# Patient Record
Sex: Male | Born: 1940 | Race: White | Hispanic: No
Health system: Western US, Academic
[De-identification: ages and names within clinical notes are randomized; demographics above are authoritative.]

## PROBLEM LIST (undated history)

## (undated) DEATH — deceased

---

## 2021-03-07 ENCOUNTER — Emergency Department
Admission: EM | Admit: 2021-03-07 | Discharge: 2021-03-07 | Discharge status: Against Medical Advice | Payer: Medicare Other | Attending: Emergency Medicine | Admitting: Emergency Medicine

## 2021-03-07 DIAGNOSIS — L989 Disorder of the skin and subcutaneous tissue, unspecified: Secondary | ICD-10-CM | POA: Insufficient documentation

## 2021-03-07 DIAGNOSIS — Z5321 Procedure and treatment not carried out due to patient leaving prior to being seen by health care provider: Secondary | ICD-10-CM

## 2021-03-07 NOTE — ED Triage Notes (Signed)
Pt to ER with report of skin tear to left arm x1 week ago, with re-injury today. Unable to control bleeding at home due to blood thinner use. Unknown blood thinner.

## 2021-09-15 IMAGING — US DOP ARTERIAL LWR EXT RT
1 series · 14 of 16 positions shown · non-contrast
Comparison: None.

HISTORY: 78-year-old Male with claudication.
TECHNIQUE: Using real-time and Doppler ultrasound, the right leg arterial system was evaluated.

[Series 1: dop arterial lwr ext right · arterial · 14 of 25 slices shown]
[im 1/25]
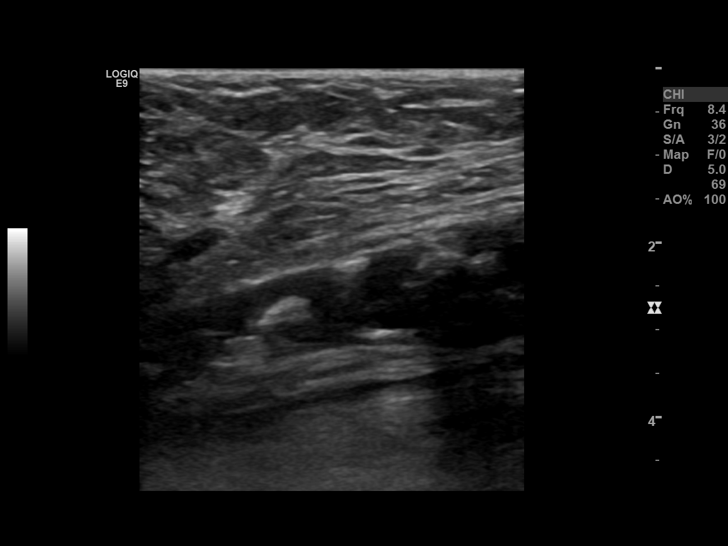
[im 2/25]
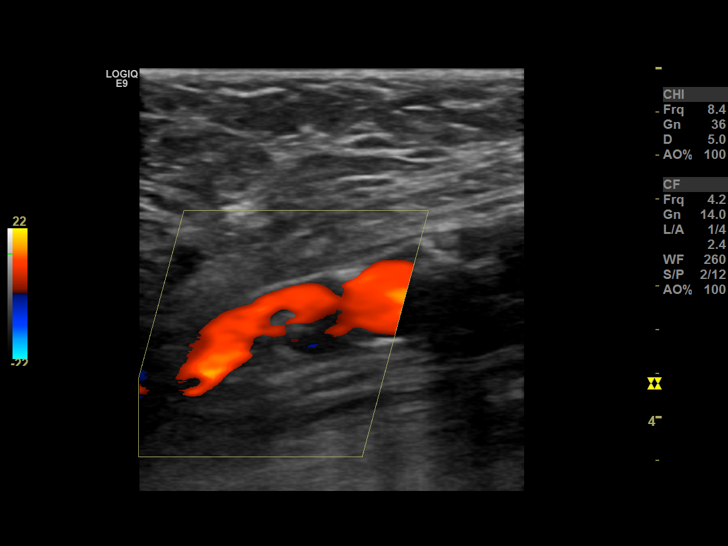
[im 4/25]
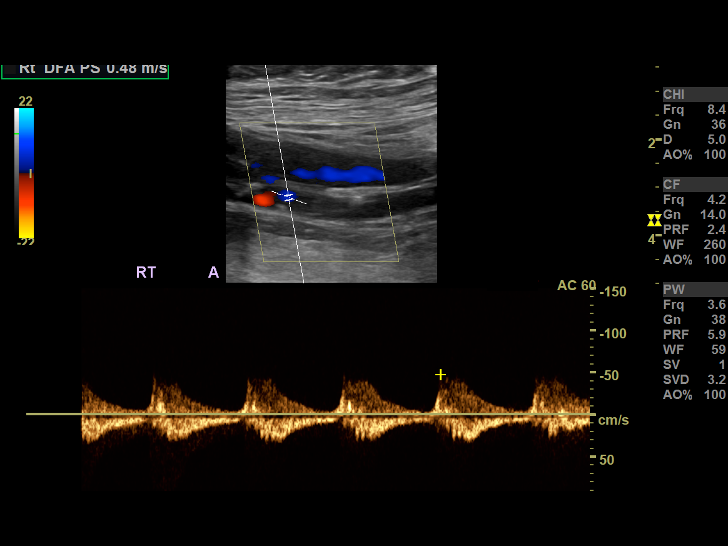
[im 7/25]
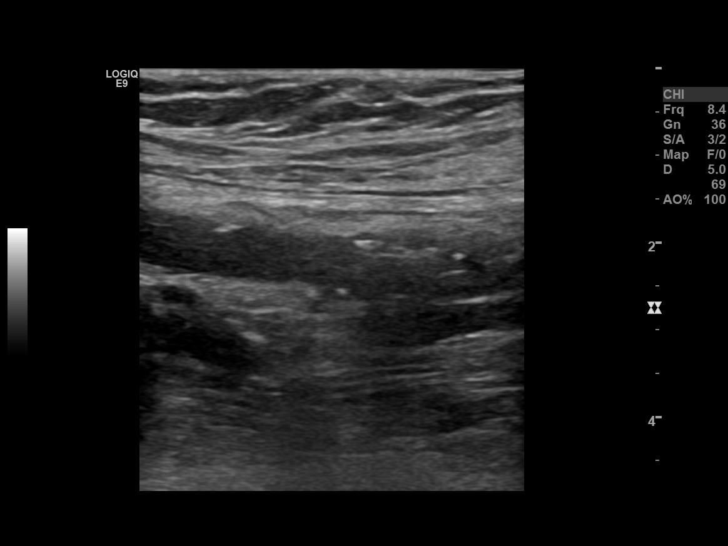
[im 9/25]
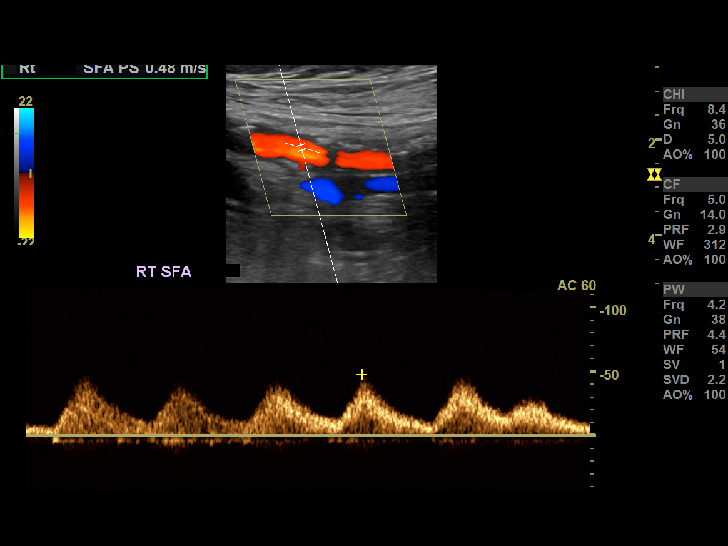
[im 10/25]
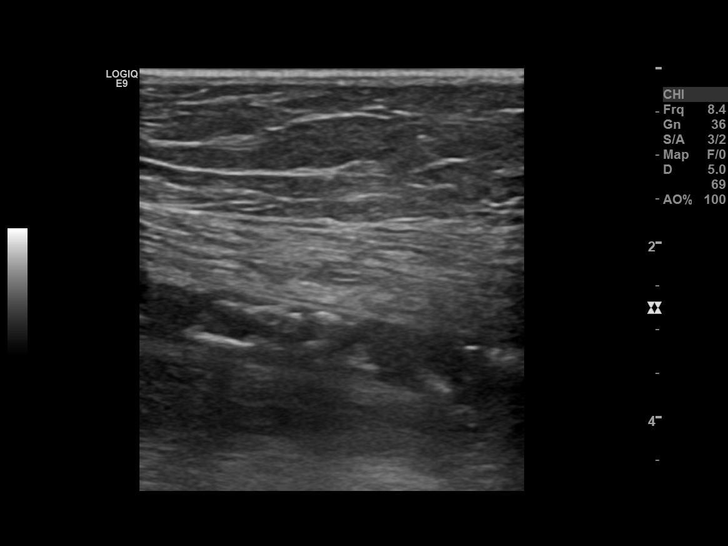
[im 12/25]
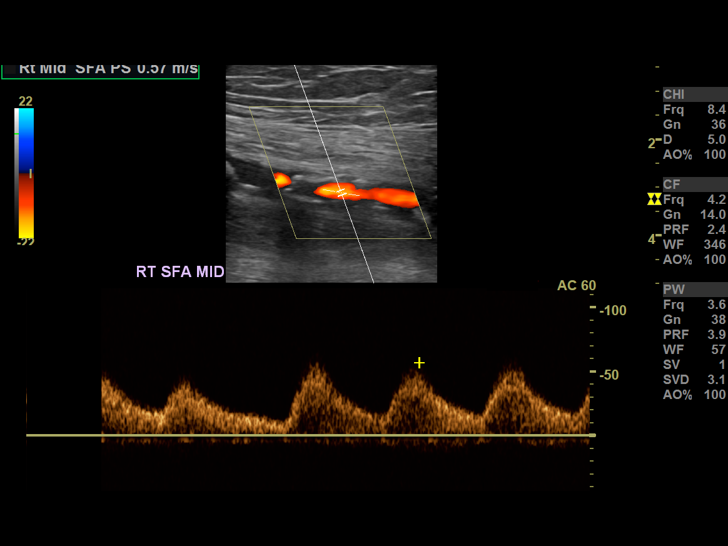
[im 13/25]
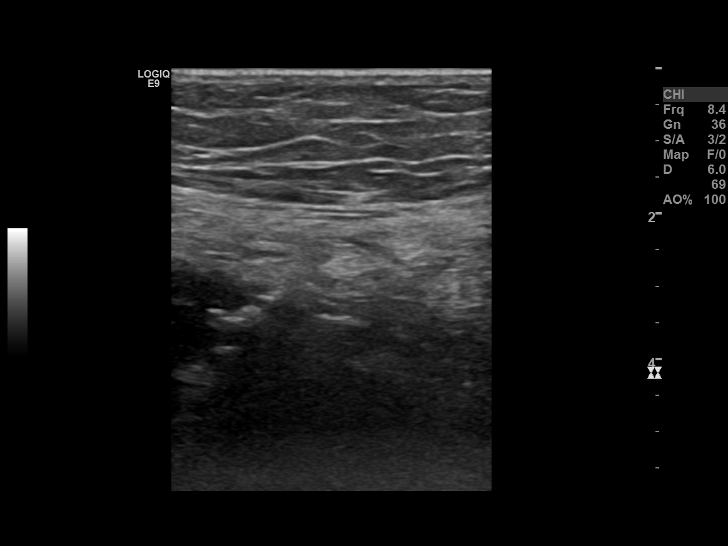
[im 15/25]
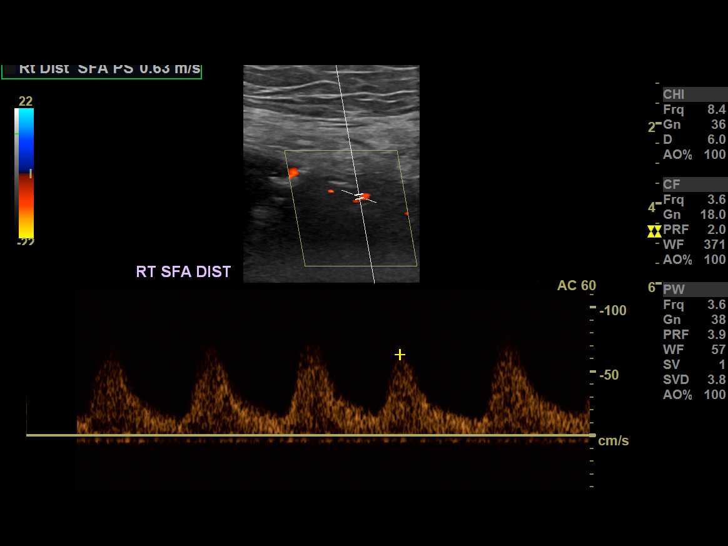
[im 17/25]
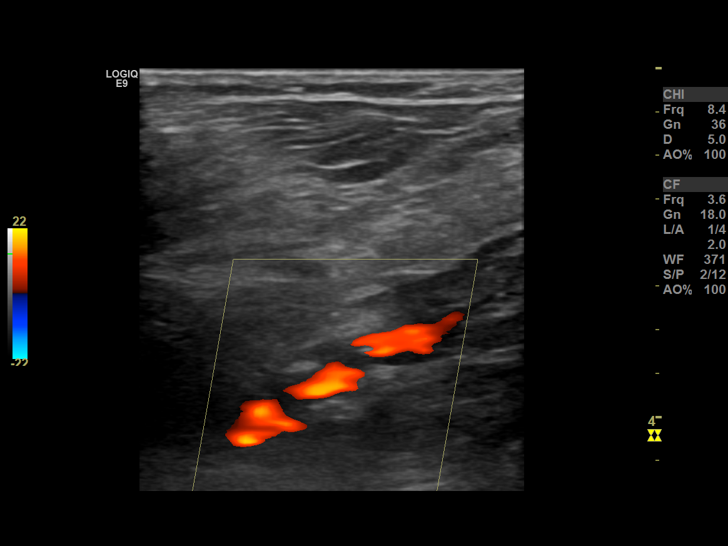
[im 20/25]
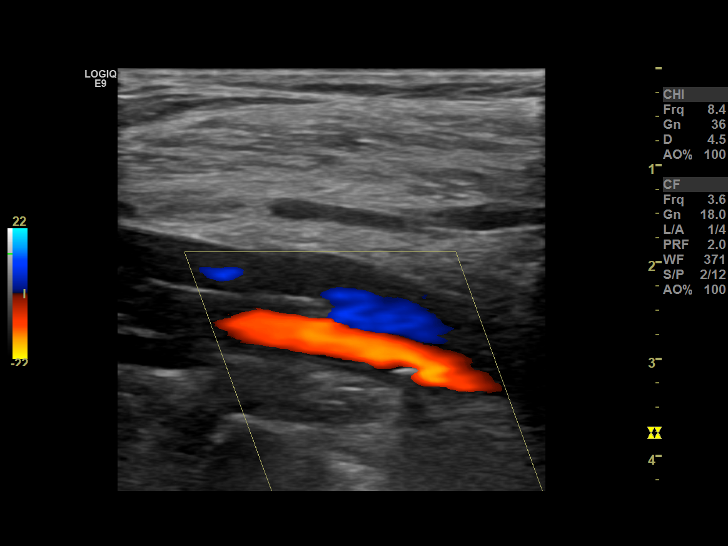
[im 21/25]
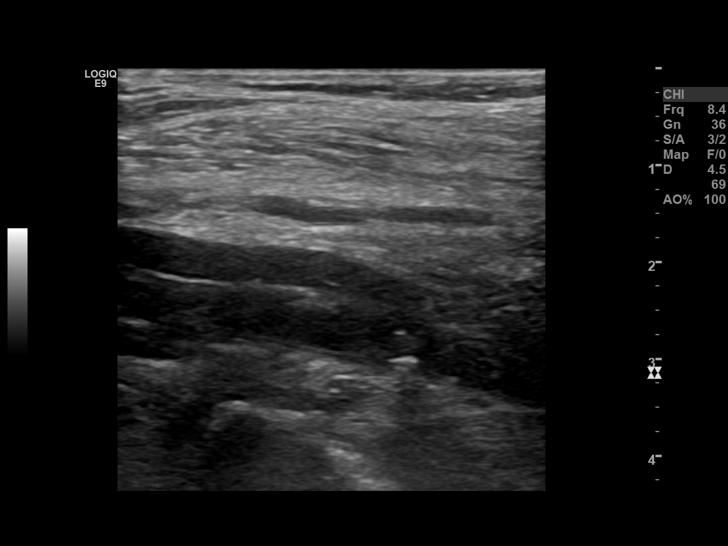
[im 23/25]
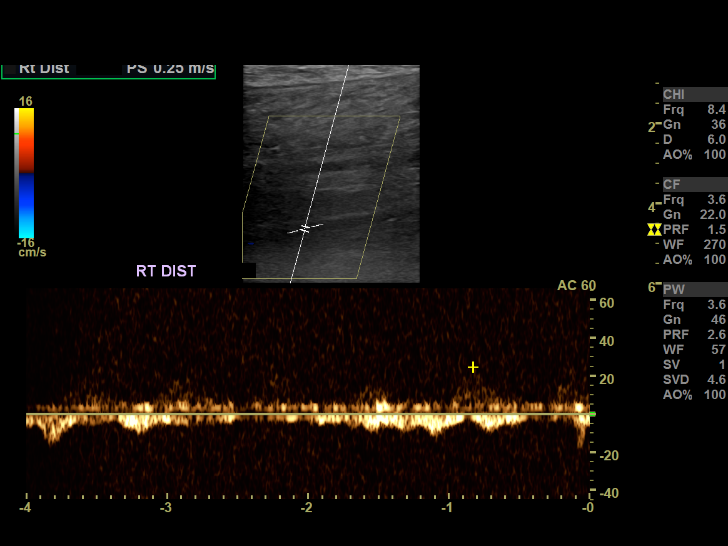
[im 25/25]
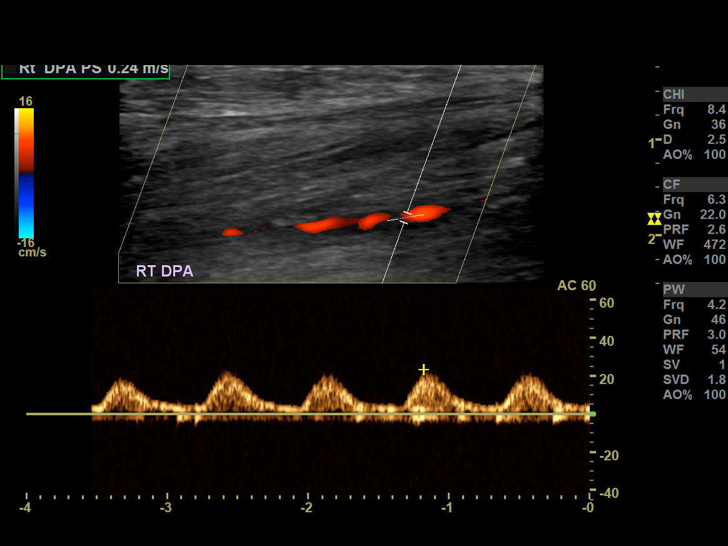

[14 of 16 positions shown; findings below may reference images not displayed]

FINDINGS: The right leg arterial system demonstrates the following waveforms and peak systolic velocities (m/s):

Common femoral artery: Monophasic, 1.02 m/s

Profunda femoral artery: Monophasic, 0.48 m/s

Superficial femoral artery proximal: Monophasic, 0.48 m/s

Superficial femoral artery mid: Monophasic, 0.57 m/s

Superficial femoral artery distal: Monophasic, 0.63 m/s

Popliteal artery proximal: Monophasic, 0.39 m/s

Popliteal artery distal: Monophasic, 0.37 m/s

Anterior tibial artery: Monophasic,  0.36 m/s

Posterior tibial artery: Monophasic, 0.36 m/s

Peroneal artery: Monophasic, 0.25 m/s

Dorsalis pedis artery: Monophasic, 0.24 m/s

Cardiac rhythm: Regular.
IMPRESSION: Findings consistent with severe peripheral vascular disease involving the right leg arterial system. Consider additional MR angiography evaluation of both lower extremities to delineate the anatomy.
